# Patient Record
Sex: Male | Born: 1975
Health system: Southern US, Community
[De-identification: ages and names within clinical notes are randomized; demographics above are authoritative.]

## PROBLEM LIST (undated history)

## (undated) DIAGNOSIS — Z87448 Personal history of other diseases of urinary system: Secondary | ICD-10-CM

## (undated) DIAGNOSIS — Z940 Kidney transplant status: Secondary | ICD-10-CM

## (undated) HISTORY — DX: Kidney transplant status: Z94.0

## (undated) HISTORY — PX: NEPHRECTOMY TRANSPLANTED ORGAN: SUR880

## (undated) HISTORY — DX: Personal history of other diseases of urinary system: Z87.448

---

## 2007-03-06 HISTORY — PX: KIDNEY TRANSPLANT: SHX239

## 2011-02-08 DIAGNOSIS — Z940 Kidney transplant status: Secondary | ICD-10-CM | POA: Insufficient documentation

## 2011-02-08 DIAGNOSIS — I129 Hypertensive chronic kidney disease with stage 1 through stage 4 chronic kidney disease, or unspecified chronic kidney disease: Secondary | ICD-10-CM | POA: Insufficient documentation

## 2011-11-13 ENCOUNTER — Encounter: Payer: Self-pay | Admitting: Family Medicine

## 2011-11-13 ENCOUNTER — Ambulatory Visit (INDEPENDENT_AMBULATORY_CARE_PROVIDER_SITE_OTHER): Payer: Self-pay | Admitting: Family Medicine

## 2011-11-13 VITALS — BP 126/77 | HR 64 | Ht 72.0 in | Wt 142.0 lb

## 2011-11-13 DIAGNOSIS — Z87448 Personal history of other diseases of urinary system: Secondary | ICD-10-CM

## 2011-11-13 DIAGNOSIS — Z940 Kidney transplant status: Secondary | ICD-10-CM

## 2011-11-13 MED ORDER — MYCOPHENOLATE MOFETIL 500 MG PO TABS: 500.0000 mg | ORAL_TABLET | Freq: Two times a day (BID) | ORAL | Status: DC

## 2011-11-13 MED ORDER — TACROLIMUS 0.5 MG PO CAPS: 0.5000 mg | ORAL_CAPSULE | Freq: Two times a day (BID) | ORAL | Status: AC

## 2011-11-13 MED ORDER — PREDNISONE 5 MG PO TABS: 5.0000 mg | ORAL_TABLET | Freq: Every day | ORAL | Status: DC

## 2011-11-13 MED ORDER — TACROLIMUS 1 MG PO CAPS: 2.0000 mg | ORAL_CAPSULE | Freq: Two times a day (BID) | ORAL | Status: DC

## 2011-11-13 NOTE — Patient Instructions (Signed)
Health Maintenance, Males A healthy lifestyle and preventative care can promote health and wellness.  Maintain regular health, dental, and eye exams.   Eat a healthy diet. Foods like vegetables, fruits, whole grains, low-fat dairy products, and lean protein foods contain the nutrients you need without too many calories. Decrease your intake of foods high in solid fats, added sugars, and salt. Get information about a proper diet from your caregiver, if necessary.   Regular physical exercise is one of the most important things you can do for your health. Most adults should get at least 150 minutes of moderate-intensity exercise (any activity that increases your heart rate and causes you to sweat) each week. In addition, most adults need muscle-strengthening exercises on 2 or more days a week.    Maintain a healthy weight. The body mass index (BMI) is a screening tool to identify possible weight problems. It provides an estimate of body fat based on height and weight. Your caregiver can help determine your BMI, and can help you achieve or maintain a healthy weight. For adults 20 years and older:   A BMI below 18.5 is considered underweight.   A BMI of 18.5 to 24.9 is normal.   A BMI of 25 to 29.9 is considered overweight.   A BMI of 30 and above is considered obese.   Maintain normal blood lipids and cholesterol by exercising and minimizing your intake of saturated fat. Eat a balanced diet with plenty of fruits and vegetables. Blood tests for lipids and cholesterol should begin at age 20 and be repeated every 5 years. If your lipid or cholesterol levels are high, you are over 50, or you are a high risk for heart disease, you may need your cholesterol levels checked more frequently.Ongoing high lipid and cholesterol levels should be treated with medicines, if diet and exercise are not effective.   If you smoke, find out from your caregiver how to quit. If you do not use tobacco, do not start.    If you choose to drink alcohol, do not exceed 2 drinks per day. One drink is considered to be 12 ounces (355 mL) of beer, 5 ounces (148 mL) of wine, or 1.5 ounces (44 mL) of liquor.   Avoid use of street drugs. Do not share needles with anyone. Ask for help if you need support or instructions about stopping the use of drugs.   High blood pressure causes heart disease and increases the risk of stroke. Blood pressure should be checked at least every 1 to 2 years. Ongoing high blood pressure should be treated with medicines if weight loss and exercise are not effective.   If you are 45 to 34 years old, ask your caregiver if you should take aspirin to prevent heart disease.   Diabetes screening involves taking a blood sample to check your fasting blood sugar level. This should be done once every 3 years, after age 45, if you are within normal weight and without risk factors for diabetes. Testing should be considered at a younger age or be carried out more frequently if you are overweight and have at least 1 risk factor for diabetes.   Colorectal cancer can be detected and often prevented. Most routine colorectal cancer screening begins at the age of 50 and continues through age 75. However, your caregiver may recommend screening at an earlier age if you have risk factors for colon cancer. On a yearly basis, your caregiver may provide home test kits to check for hidden   blood in the stool. Use of a small camera at the end of a tube, to directly examine the colon (sigmoidoscopy or colonoscopy), can detect the earliest forms of colorectal cancer. Talk to your caregiver about this at age 50, when routine screening begins. Direct examination of the colon should be repeated every 5 to 10 years through age 75, unless early forms of pre-cancerous polyps or small growths are found.   Hepatitis C blood testing is recommended for all people born from 1945 through 1965 and any individual with known risks for  hepatitis C.   Healthy men should no longer receive prostate-specific antigen (PSA) blood tests as part of routine cancer screening. Consult with your caregiver about prostate cancer screening.   Testicular cancer screening is not recommended for adolescents or adult males who have no symptoms. Screening includes self-exam, caregiver exam, and other screening tests. Consult with your caregiver about any symptoms you have or any concerns you have about testicular cancer.   Practice safe sex. Use condoms and avoid high-risk sexual practices to reduce the spread of sexually transmitted infections (STIs).   Use sunscreen with a sun protection factor (SPF) of 30 or greater. Apply sunscreen liberally and repeatedly throughout the day. You should seek shade when your shadow is shorter than you. Protect yourself by wearing long sleeves, pants, a wide-brimmed hat, and sunglasses year round, whenever you are outdoors.   Notify your caregiver of new moles or changes in moles, especially if there is a change in shape or color. Also notify your caregiver if a mole is larger than the size of a pencil eraser.   A one-time screening for abdominal aortic aneurysm (AAA) and surgical repair of large AAAs by sound wave imaging (ultrasonography) is recommended for ages 65 to 75 years who are current or former smokers.   Stay current with your immunizations.  Document Released: 08/18/2007 Document Revised: 02/08/2011 Document Reviewed: 07/17/2010 [employer] Patient Information 2012 [employer]. 

## 2011-11-13 NOTE — Progress Notes (Signed)
  Subjective:    Patient ID: Andrew Maxwell, male    DOB: 1975-06-04, 36 y.o.   MRN: [id number]  HPI  The patient is here to get established for nephrology referral through the [facility]. Patient had kidney transplant 4 years ago this December and has done quite well he is currently on 3 different medications the CellCept , Deltasone and Prograf which he takes daily. Patient normally sees his nephrologist in December and we will need to try to maintain that   Review of Systems  All other systems reviewed and are negative.     BP 126/77  Pulse 64  Ht 6' (1.829 m)  Wt 142 lb (64.411 kg)  BMI 19.26 kg/m2  SpO2 100% Objective:   Physical Exam  Constitutional: He is oriented to person, place, and time. He appears well-developed and well-nourished.  HENT:  Head: Normocephalic.  Neck: Neck supple.  Cardiovascular: Normal rate, regular rhythm and normal heart sounds.   No murmur heard. Pulmonary/Chest: Effort normal and breath sounds normal. No respiratory distress.  Musculoskeletal: Normal range of motion.  Neurological: He is alert and oriented to person, place, and time.  Skin: Skin is warm and dry.  Psychiatric: He has a normal mood and affect.      Assessment & Plan:  #1 history renal failure with subsequent kidney transplant. We will make a referral to come system nephrologist. I'll give him one month and one refill of his four medications that he uses to prevent rejection and make a referral to nephrologist. Since he is using the indigent program through [facility] both the nephrologist and blood work needs to be done at a [facility] facility.

## 2012-03-06 DIAGNOSIS — Z7960 Long term (current) use of unspecified immunomodulators and immunosuppressants: Secondary | ICD-10-CM | POA: Insufficient documentation

## 2012-03-06 DIAGNOSIS — Z79899 Other long term (current) drug therapy: Secondary | ICD-10-CM | POA: Insufficient documentation

## 2012-04-08 ENCOUNTER — Ambulatory Visit (INDEPENDENT_AMBULATORY_CARE_PROVIDER_SITE_OTHER): Payer: Self-pay | Admitting: Family Medicine

## 2012-04-08 ENCOUNTER — Encounter: Payer: Self-pay | Admitting: Family Medicine

## 2012-04-08 VITALS — BP 138/71 | HR 81 | Temp 98.3°F | Wt 153.0 lb

## 2012-04-08 DIAGNOSIS — J329 Chronic sinusitis, unspecified: Secondary | ICD-10-CM

## 2012-04-08 DIAGNOSIS — A499 Bacterial infection, unspecified: Secondary | ICD-10-CM

## 2012-04-08 DIAGNOSIS — B9689 Other specified bacterial agents as the cause of diseases classified elsewhere: Secondary | ICD-10-CM

## 2012-04-08 MED ORDER — AMOXICILLIN-POT CLAVULANATE 500-125 MG PO TABS: ORAL_TABLET | ORAL | Status: AC

## 2012-04-08 NOTE — Progress Notes (Signed)
CC: Andrew Maxwell is a 37 y.o. male is here for Sinusitis and Sore Throat   Subjective: HPI:  Patient complains of facial pressure localized between the eyes and just above the left eye. It is worse with lying down. It has been present for a little more than 7 days. It started with fatigue and mild cough however both of these had resolved without intervention.  He describes mild subjective fevers and chills for the past 2 days. He's had a runny nose with increasing volume and thickness of dark discharge. There been no interventions as of yet. Symptoms are described as moderate in severity, worsening on a daily basis. Present all hours a day but not interfering with sleep. Nothing makes symptoms better or worse other than above. Denies motor or sensory disturbances, headaches other than that described above. Denies confusion, sore throat, hearing loss, eye pain, ocular complaints, ear complaints, shortness of breath, cough.   Review Of Systems Outlined In HPI  Past Medical History  Diagnosis Date  . H/O renal failure      History reviewed. No pertinent family history.   History  Substance Use Topics  . Smoking status: Never Smoker   . Smokeless tobacco: Never Used  . Alcohol Use: No     Objective: Filed Vitals:   04/08/12 1438  BP: 138/71  Pulse: 81  Temp: 98.3 F (36.8 C)    General: Alert and Oriented, No Acute Distress HEENT: Pupils equal, round, reactive to light. Conjunctivae clear.  External ears unremarkable, canals clear with intact TMs with appropriate landmarks.  Middle ear appears open without effusion. Erythematous and boggy inferior turbinates with moderate mucoid discharge.  Moist mucous membranes, pharynx without inflammation nor lesions.  Neck supple without palpable lymphadenopathy nor abnormal masses. Left frontal sinus tenderness to percussion Lungs: Clear to auscultation bilaterally, no wheezing/ronchi/rales.  Comfortable work of breathing. Good air  movement. Cardiac: Regular rate and rhythm. Normal S1/S2.  No murmurs, rubs, nor gallops.   Extremities: No peripheral edema.  Strong peripheral pulses.  Mental Status: No depression, anxiety, nor agitation. Skin: Warm and dry.  Assessment & Plan: [name] was seen today for sinusitis and sore throat.  Diagnoses and associated orders for this visit:  Bacterial sinusitis - amoxicillin-clavulanate (AUGMENTIN) 500-125 MG per tablet; Take one by mouth every 8 hours for ten total days.    Bacterial sinusitis: Discussed likelihood that this still could be viral sinusitis given duration greater than 7 days and immunosuppression therapy joint decision to start Augmentin. Symptom control with nasal saline, humidifier, alka seltzer cold and soinus.Signs and symptoms requring emergent/urgent reevaluation were discussed with the patient.  Return if symptoms worsen or fail to improve.

## 2012-10-26 ENCOUNTER — Emergency Department
Admission: EM | Admit: 2012-10-26 | Discharge: 2012-10-26 | Disposition: A | Discharge status: Home / Self Care | Payer: BC Managed Care – PPO | Source: Home / Self Care | Attending: Emergency Medicine | Admitting: Emergency Medicine

## 2012-10-26 DIAGNOSIS — J069 Acute upper respiratory infection, unspecified: Secondary | ICD-10-CM

## 2012-10-26 MED ORDER — AMOXICILLIN-POT CLAVULANATE 875-125 MG PO TABS: 1.0000 | ORAL_TABLET | Freq: Two times a day (BID) | ORAL | Status: DC

## 2012-10-26 NOTE — ED Notes (Signed)
States sinus pressure and congestion started yesterday without relief from Zyrtec

## 2012-10-26 NOTE — ED Provider Notes (Signed)
CSN: [id number]     Arrival date & time 10/26/12  1640 History     First MD Initiated Contact with Patient 10/26/12 1648     Chief Complaint  Patient presents with  . Facial Pain   (Consider location/radiation/quality/duration/timing/severity/associated sxs/prior Treatment) HPI Andrew Maxwell is a 37 y.o. male who complains of onset of cold symptoms for 3 days.  The symptoms are constant and mild-moderate in severity.  He has a history of a kidney transplant.  Flying later this week. No sore throat No cough No pleuritic pain No wheezing + nasal congestion + post-nasal drainage + sinus pain/pressure No chest congestion No itchy/red eyes No earache No hemoptysis No SOB No chills/sweats No fever No nausea No vomiting No abdominal pain No diarrhea No skin rashes No fatigue No myalgias No headache    Past Medical History  Diagnosis Date  . H/O renal failure   . History of renal transplant    Past Surgical History  Procedure Laterality Date  . Kidney transplant  2009   Family History  Problem Relation Age of Onset  . Diabetes Neg Hx   . Hypertension Neg Hx    History  Substance Use Topics  . Smoking status: Never Smoker   . Smokeless tobacco: Never Used  . Alcohol Use: No    Review of Systems  All other systems reviewed and are negative.    Allergies  Review of patient's allergies indicates no known allergies.  Home Medications   Current Outpatient Rx  Name  Route  Sig  Dispense  Refill  . amoxicillin-clavulanate (AUGMENTIN) 875-125 MG per tablet   Oral   Take 1 tablet by mouth 2 (two) times daily.   14 tablet   0   . mycophenolate (CELLCEPT) 500 MG tablet   Oral   Take 1 tablet (500 mg total) by mouth 2 (two) times daily.   60 tablet   1   . predniSONE (DELTASONE) 5 MG tablet   Oral   Take 1 tablet (5 mg total) by mouth daily.   30 tablet   1   . tacrolimus (PROGRAF) 0.5 MG capsule   Oral   Take 1 capsule (0.5 mg total) by mouth 2 (two)  times daily.   60 capsule   1   . tacrolimus (PROGRAF) 1 MG capsule   Oral   Take 2 capsules (2 mg total) by mouth 2 (two) times daily.   120 capsule   1    BP 110/65  Pulse 60  Temp(Src) 97.8 F (36.6 C) (Oral)  Ht 6' (1.829 m)  Wt 141 lb 8 oz (64.184 kg)  BMI 19.19 kg/m2  SpO2 99% Physical Exam  Nursing note and vitals reviewed. Constitutional: He is oriented to person, place, and time. He appears well-developed and well-nourished.  HENT:  Head: Normocephalic and atraumatic.  Right Ear: Tympanic membrane, external ear and ear canal normal.  Left Ear: Tympanic membrane, external ear and ear canal normal.  Nose: Mucosal edema and rhinorrhea present.  Mouth/Throat: Posterior oropharyngeal erythema present. No oropharyngeal exudate or posterior oropharyngeal edema.  Eyes: No scleral icterus.  Neck: Neck supple.  Cardiovascular: Regular rhythm and normal heart sounds.   Pulmonary/Chest: Effort normal and breath sounds normal. No respiratory distress.  Neurological: He is alert and oriented to person, place, and time.  Skin: Skin is warm and dry.  Psychiatric: He has a normal mood and affect. His speech is normal.    ED Course   Procedures (including critical  care time)  Labs Reviewed - No data to display No results found. 1. Acute upper respiratory infections of unspecified site     MDM  1)  Take the prescribed antibiotic as instructed. 2)  Use nasal saline solution (over the counter) at least 3 times a day. 3)  Use over the counter decongestants like Zyrtec-D every 12 hours as needed to help with congestion.  If you have hypertension, do not take medicines with sudafed.  4)  Can take tylenol every 6 hours or motrin every 8 hours for pain or fever. 5)  Follow up with your primary doctor if no improvement in 5-7 days, sooner if increasing pain, fever, or new symptoms.     Marlaine Hind, MD 10/26/12 [phone]

## 2013-03-24 ENCOUNTER — Ambulatory Visit (INDEPENDENT_AMBULATORY_CARE_PROVIDER_SITE_OTHER): Payer: BC Managed Care – PPO | Admitting: Family Medicine

## 2013-03-24 ENCOUNTER — Encounter: Payer: Self-pay | Admitting: Family Medicine

## 2013-03-24 VITALS — BP 122/66 | HR 87 | Temp 100.6°F | Wt 148.0 lb

## 2013-03-24 DIAGNOSIS — R509 Fever, unspecified: Secondary | ICD-10-CM

## 2013-03-24 LAB — POCT INFLUENZA A/B
INFLUENZA B, POC: NEGATIVE
Influenza A, POC: NEGATIVE

## 2013-03-24 MED ORDER — OSELTAMIVIR PHOSPHATE 75 MG PO CAPS: 75.0000 mg | ORAL_CAPSULE | Freq: Two times a day (BID) | ORAL | Status: DC

## 2013-03-24 NOTE — Progress Notes (Signed)
CC: Andrew MiyamotoStephen Maxwell is a 38 y.o. male is here for Fever   Subjective: HPI:  Complains of fever with a maximum 103.1 earlier this morning that's all began on Monday morning. Was accompanied by chills and subjective fevers onset at the same time. Sunday was beginning to have mild nasal congestion and moderate cough that is worse in the evening almost absent during daytime. Nasal congestion is persistent.Symptoms overall are moderate in severity. No interventions as of yet no sick contacts. Denies confusion, motor sensory disturbances, chest pain, shortness of breath, back pain, abdominal pain, GI disturbance, rashes nor dysuria   Review Of Systems Outlined In HPI  Past Medical History  Diagnosis Date  . H/O renal failure   . History of renal transplant      Family History  Problem Relation Age of Onset  . Diabetes Neg Hx   . Hypertension Neg Hx      History  Substance Use Topics  . Smoking status: Never Smoker   . Smokeless tobacco: Never Used  . Alcohol Use: No     Objective: Filed Vitals:   03/24/13 1358  BP: 122/66  Pulse: 87  Temp: 100.6 F (38.1 C)    General: Alert and Oriented, No Acute Distress appears mildly fatigued HEENT: Pupils equal, round, reactive to light. Conjunctivae clear.  External ears unremarkable, canals clear with intact TMs with appropriate landmarks.  Middle ear appears open without effusion. Pink inferior turbinates.  Moist mucous membranes, pharynx without inflammation nor lesions.  Neck supple without palpable lymphadenopathy nor abnormal masses. Lungs: Clear to auscultation bilaterally, no wheezing/ronchi/rales.  Comfortable work of breathing. Good air movement. Cardiac: Regular rate and rhythm. Normal S1/S2.  No murmurs, rubs, nor gallops.   Abdomen: Soft nontender Skin: Warm and dry.  Assessment & Plan: Andrew SeniorStephen was seen today for fever.  Diagnoses and associated orders for this visit:  Fever, unspecified - POCT Influenza A/B  Other  Orders - oseltamivir (TAMIFLU) 75 MG capsule; Take 1 capsule (75 mg total) by mouth 2 (two) times daily.    Rapid flu test is negative however clinical suspicion is high, given his immunosuppression I would like him to start on Tamiflu, there is no current indication for an antibiotic at this time but we discussed signs and symptoms that would warrant an antibiotic I've asked him to call us if any of these evolve.  Over-the-counter flu or cold medications for flu provided they do not have ibuprofen or nonsteroidal anti-inflammatories  Return if symptoms worsen or fail to improve.

## 2013-03-26 ENCOUNTER — Ambulatory Visit (INDEPENDENT_AMBULATORY_CARE_PROVIDER_SITE_OTHER): Payer: BC Managed Care – PPO | Admitting: Family Medicine

## 2013-03-26 ENCOUNTER — Ambulatory Visit (INDEPENDENT_AMBULATORY_CARE_PROVIDER_SITE_OTHER): Payer: BC Managed Care – PPO

## 2013-03-26 ENCOUNTER — Encounter: Payer: Self-pay | Admitting: Family Medicine

## 2013-03-26 VITALS — BP 111/61 | HR 66 | Temp 97.3°F | Wt 144.0 lb

## 2013-03-26 DIAGNOSIS — R509 Fever, unspecified: Secondary | ICD-10-CM

## 2013-03-26 DIAGNOSIS — R05 Cough: Secondary | ICD-10-CM

## 2013-03-26 DIAGNOSIS — R059 Cough, unspecified: Secondary | ICD-10-CM

## 2013-03-26 DIAGNOSIS — J3489 Other specified disorders of nose and nasal sinuses: Secondary | ICD-10-CM

## 2013-03-26 DIAGNOSIS — J189 Pneumonia, unspecified organism: Secondary | ICD-10-CM

## 2013-03-26 MED ORDER — AZITHROMYCIN 250 MG PO TABS: ORAL_TABLET | ORAL | Status: AC

## 2013-03-26 NOTE — Progress Notes (Signed)
CC: Ronaldo MiyamotoStephen Martenson is a 38 y.o. male is here for pain in back   Subjective: HPI:  Patient complains of continued cough of moderate severity nonproductive accompanied now with back pain localized on the right side of the back not radiating present only with coughing or deep breathing. Cough is unchanged since I saw him last however pain is new and started yesterday morning. Pain is moderate in severity absent at rest or when not breathing. Described only as a sharpness. Overall he feels somewhat better since starting Tamiflu no fever as of yesterday chills are now absent. Appetite is picking back up. Denies blood in sputum, shortness of breath, wheezing, orthopnea, myalgias, nor midline back pain   Review Of Systems Outlined In HPI  Past Medical History  Diagnosis Date  . H/O renal failure   . History of renal transplant      Family History  Problem Relation Age of Onset  . Diabetes Neg Hx   . Hypertension Neg Hx      History  Substance Use Topics  . Smoking status: Never Smoker   . Smokeless tobacco: Never Used  . Alcohol Use: No     Objective: Filed Vitals:   03/26/13 0912  BP: 111/61  Pulse: 66  Temp: 97.3 F (36.3 C)    General: Alert and Oriented, No Acute Distress HEENT: Pupils equal, round, reactive to light. Conjunctivae clear.  External ears unremarkable, canals clear with intact TMs with appropriate landmarks.  Middle ear appears open without effusion. Pink inferior turbinates.  Moist mucous membranes, pharynx without inflammation nor lesions.  Neck supple without palpable lymphadenopathy nor abnormal masses. Lungs: Clear to auscultation bilaterally, no wheezing/ronchi/rales.  Comfortable work of breathing. Good air movement. Cardiac: Regular rate and rhythm. Normal S1/S2.  No murmurs, rubs, nor gallops.   Mental Status: No depression, anxiety, nor agitation. Skin: Warm and dry.  Assessment & Plan: Jeannett SeniorStephen was seen today for pain in back.  Diagnoses and  associated orders for this visit:  Cough - DG Chest 2 View; Future  Fever - DG Chest 2 View; Future  CAP (community acquired pneumonia) - azithromycin (ZITHROMAX) 250 MG tablet; Take two tabs at once on day 1, then one tab daily on days 2-5.    Given concern for pneumonia a chest x-ray was obtained: There is no radiographic evidence of pneumonia. Given his immunosuppression I would like to be more aggressive than the average individual by starting azithromycin for any chance that radiographic evidence of pneumonia might not be apparent.  Return if symptoms worsen or fail to improve.

## 2014-02-05 ENCOUNTER — Encounter: Payer: Self-pay | Admitting: Family Medicine

## 2014-02-05 ENCOUNTER — Ambulatory Visit (INDEPENDENT_AMBULATORY_CARE_PROVIDER_SITE_OTHER): Payer: BC Managed Care – PPO | Admitting: Family Medicine

## 2014-02-05 VITALS — BP 119/73 | HR 68 | Ht 73.0 in | Wt 146.0 lb

## 2014-02-05 DIAGNOSIS — J4530 Mild persistent asthma, uncomplicated: Secondary | ICD-10-CM | POA: Diagnosis not present

## 2014-02-05 DIAGNOSIS — Z0289 Encounter for other administrative examinations: Secondary | ICD-10-CM

## 2014-02-05 DIAGNOSIS — J45909 Unspecified asthma, uncomplicated: Secondary | ICD-10-CM | POA: Insufficient documentation

## 2014-02-05 MED ORDER — MONTELUKAST SODIUM 10 MG PO TABS: 10.0000 mg | ORAL_TABLET | Freq: Every day | ORAL | Status: DC

## 2014-02-05 MED ORDER — ALBUTEROL SULFATE HFA 108 (90 BASE) MCG/ACT IN AERS: INHALATION_SPRAY | RESPIRATORY_TRACT | Status: DC

## 2014-02-05 NOTE — Progress Notes (Signed)
CC: Andrew MiyamotoStephen Maxwell is a 38 y.o. male is here for Asthma   Subjective: HPI:  Complains of chest tightness described as inability to take a full deep breath that has been occurring when he is around pet dander and most nights of the week when trying to fall asleep. Wife notes that he is also wheezing in his most nights of the week. Symptoms are improved if he uses albuterol nebulizer that is prescribed to his daughter. Symptoms have been present for about a month now. Nothing else seems to make better or worse. He denies cough, exertional chest pain, confusion, lightheadedness, nasal congestion, sore throat, nor rash.  He had a history of asthma as a child but believes that he outgrew it in his young adulthood.   Review Of Systems Outlined In HPI  Past Medical History  Diagnosis Date  . H/O renal failure   . History of renal transplant     Past Surgical History  Procedure Laterality Date  . Kidney transplant  2009   Family History  Problem Relation Age of Onset  . Diabetes Neg Hx   . Hypertension Neg Hx     History   Social History  . Marital Status: Married    Spouse Name: N/A    Number of Children: N/A  . Years of Education: N/A   Occupational History  . Not on file.   Social History Main Topics  . Smoking status: Never Smoker   . Smokeless tobacco: Never Used  . Alcohol Use: No  . Drug Use: No  . Sexual Activity: Yes    Birth Control/ Protection: Surgical   Other Topics Concern  . Not on file   Social History Narrative     Objective: BP 119/73 mmHg  Pulse 68  Ht 6\' 1"  (1.854 m)  Wt 146 lb (66.225 kg)  BMI 19.27 kg/m2  SpO2 100%  General: Alert and Oriented, No Acute Distress HEENT: Pupils equal, round, reactive to light. Conjunctivae clear.  External ears unremarkable, canals clear with intact TMs with appropriate landmarks.  Middle ear appears open without effusion. Pink inferior turbinates.  Moist mucous membranes, pharynx without inflammation nor  lesions.  Neck supple without palpable lymphadenopathy nor abnormal masses. Lungs: Clear to auscultation bilaterally, no wheezing/ronchi/rales.  Comfortable work of breathing. Good air movement. Cardiac: Regular rate and rhythm. Normal S1/S2.  No murmurs, rubs, nor gallops.   Extremities: No peripheral edema.  Strong peripheral pulses.  Mental Status: No depression, anxiety, nor agitation. Skin: Warm and dry.  Assessment & Plan: Andrew Maxwell was seen today for asthma.  Diagnoses and associated orders for this visit:  Asthma, chronic, mild persistent, uncomplicated  Other Orders - montelukast (SINGULAIR) 10 MG tablet; Take 1 tablet (10 mg total) by mouth at bedtime. - albuterol (PROVENTIL HFA;VENTOLIN HFA) 108 (90 BASE) MCG/ACT inhaler; Inhale two puffs every 4-6 hours only as needed for shortness of breath or wheezing.    Asthma: Uncontrolled chronic condition, begin Singulair on a daily basis to prevent his chief complaint symptoms. For any breakthrough symptoms begin albuterol inhaler. Return sooner than 6 months if he begins to use albuterol more than 2 times a week or continues to have any nocturnal symptoms.  They had many questions regarding whether or not he should go see an allergist. Recommended that if Singulair resolves his symptoms and I see no need for allergy referral at this time however if any symptoms persist referral would be reasonable.  25 minutes spent face-to-face during visit today of  which at least 50% was counseling or coordinating care regarding: 1. Asthma, chronic, mild persistent, uncomplicated      Return in about 6 months (around 08/07/2014).

## 2014-04-20 ENCOUNTER — Telehealth: Payer: Self-pay | Admitting: Family Medicine

## 2014-04-20 MED ORDER — MONTELUKAST SODIUM 10 MG PO TABS: 10.0000 mg | ORAL_TABLET | Freq: Every day | ORAL | Status: DC

## 2014-04-20 NOTE — Telephone Encounter (Signed)
Andrew Maxwell, Rx placed in in-box ready for pickup/faxing. (See attachment, unsure where he wants this sent)

## 2014-04-20 NOTE — Telephone Encounter (Signed)
rx sent to [facility] [place] [facility]

## 2014-05-27 IMAGING — CR DG CHEST 2V
2 series · 2 of 2 positions shown · non-contrast
Comparison: None.

CLINICAL DATA: Cough and chest congestion.

EXAM:
CHEST  2 VIEW

[view not recorded (1 of 2)]
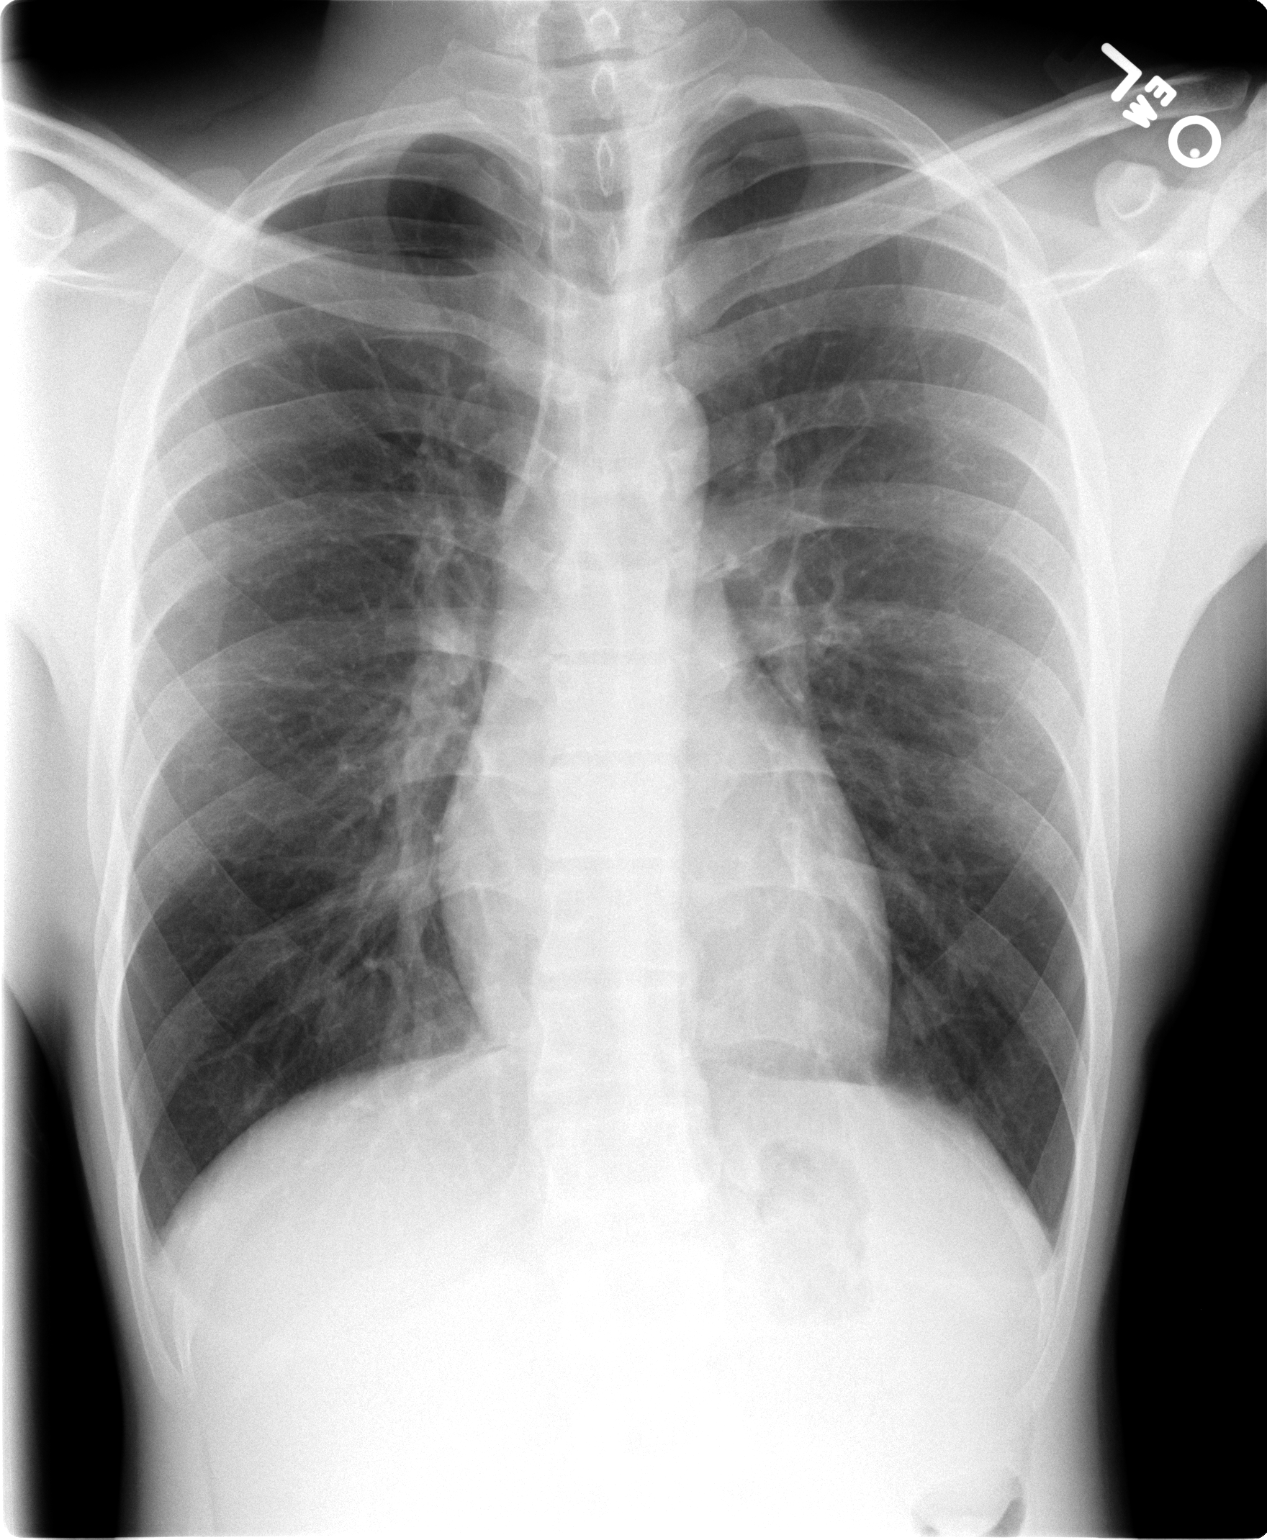

[view not recorded (2 of 2)]
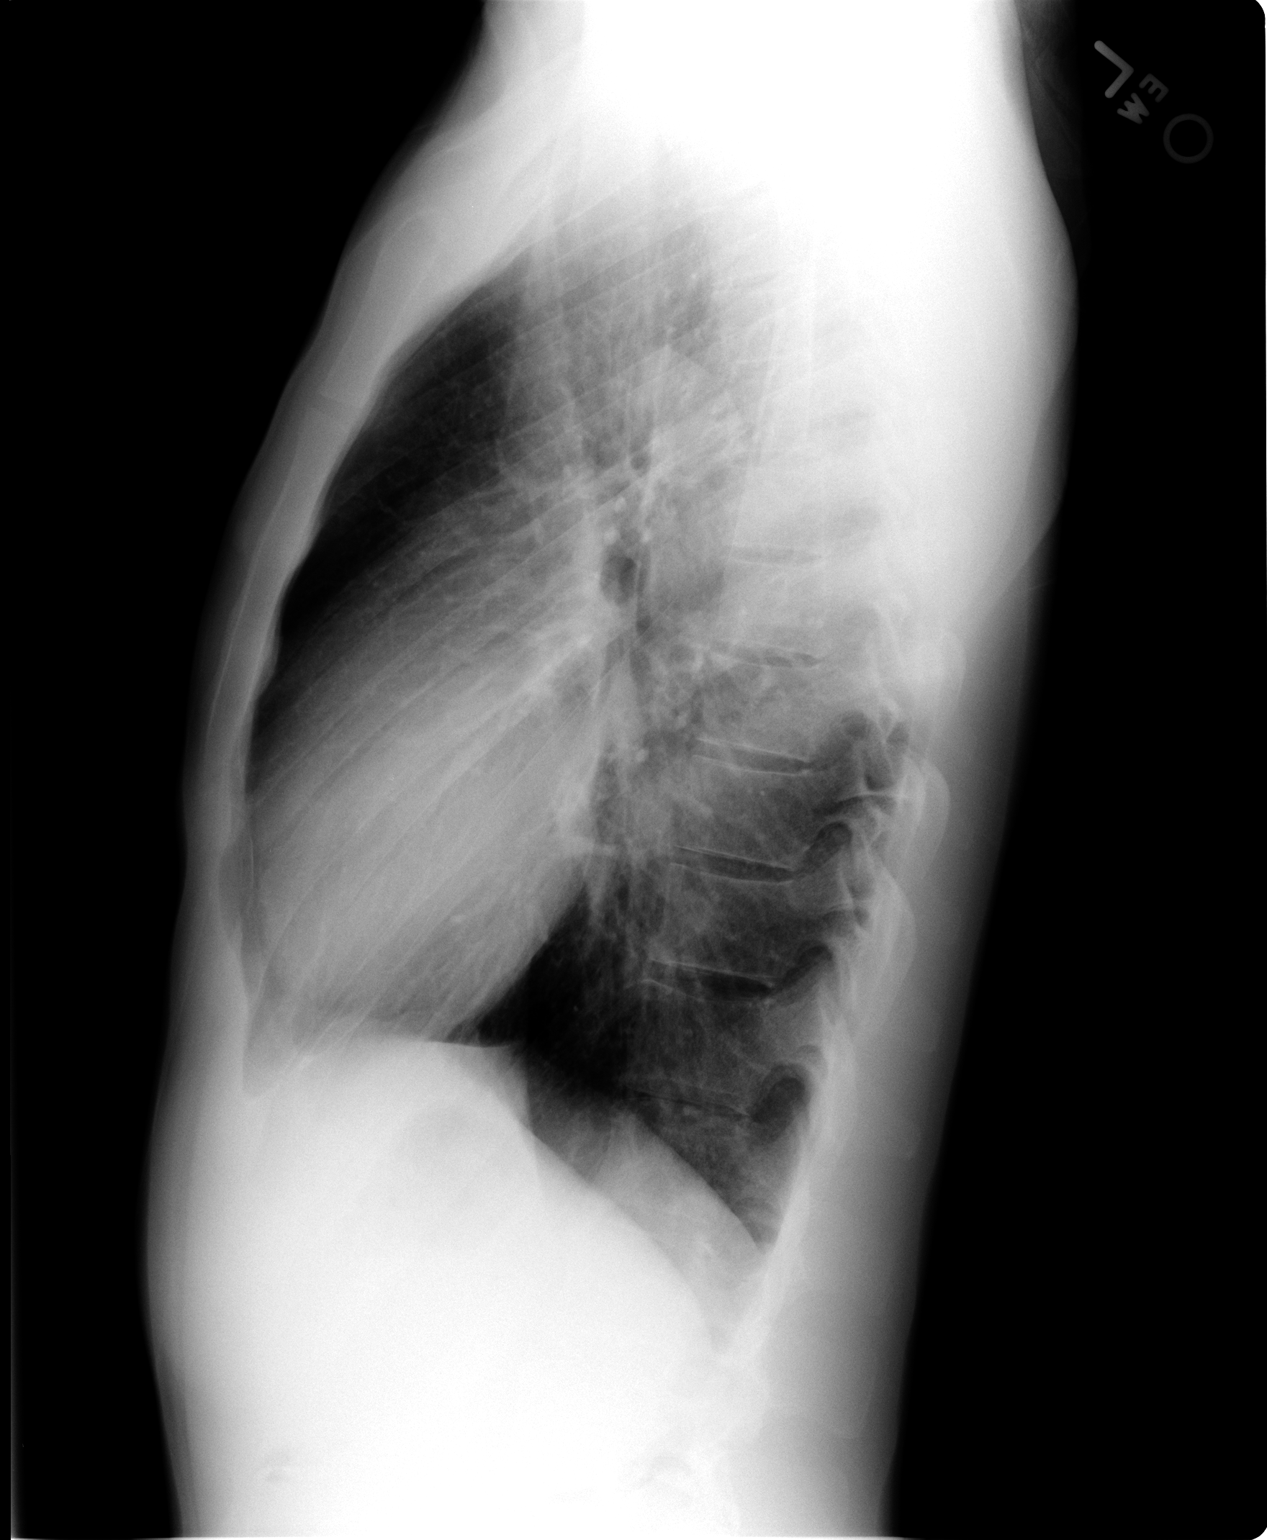

[2 of 2 positions shown; findings below may reference images not displayed]

FINDINGS: The heart size is normal. The lungs are clear. The visualized soft
tissues and bony thorax are unremarkable.
IMPRESSION: Negative two view chest.

## 2015-06-20 DIAGNOSIS — N182 Chronic kidney disease, stage 2 (mild): Secondary | ICD-10-CM

## 2015-06-20 DIAGNOSIS — D631 Anemia in chronic kidney disease: Secondary | ICD-10-CM | POA: Insufficient documentation

## 2015-09-23 ENCOUNTER — Other Ambulatory Visit: Payer: Self-pay | Admitting: Family Medicine

## 2015-12-07 ENCOUNTER — Encounter: Payer: Self-pay | Admitting: Osteopathic Medicine

## 2015-12-07 ENCOUNTER — Ambulatory Visit (INDEPENDENT_AMBULATORY_CARE_PROVIDER_SITE_OTHER): Payer: BLUE CROSS/BLUE SHIELD | Admitting: Osteopathic Medicine

## 2015-12-07 VITALS — BP 131/63 | HR 52 | Temp 97.9°F | Ht 72.0 in | Wt 141.0 lb

## 2015-12-07 DIAGNOSIS — Z23 Encounter for immunization: Secondary | ICD-10-CM

## 2015-12-07 DIAGNOSIS — J3089 Other allergic rhinitis: Secondary | ICD-10-CM | POA: Diagnosis not present

## 2015-12-07 DIAGNOSIS — Z940 Kidney transplant status: Secondary | ICD-10-CM | POA: Insufficient documentation

## 2015-12-07 DIAGNOSIS — N051 Unspecified nephritic syndrome with focal and segmental glomerular lesions: Secondary | ICD-10-CM | POA: Insufficient documentation

## 2015-12-07 MED ORDER — MONTELUKAST SODIUM 10 MG PO TABS: 10.0000 mg | ORAL_TABLET | Freq: Every day | ORAL | 1 refills | Status: DC

## 2015-12-07 MED ORDER — MONTELUKAST SODIUM 10 MG PO TABS: 10.0000 mg | ORAL_TABLET | Freq: Every day | ORAL | 4 refills | Status: AC

## 2015-12-07 NOTE — Progress Notes (Signed)
HPI: Andrew Maxwell is a 40 y.o. male  who presents to [facility] today, 12/07/15,  for chief complaint of:  Chief Complaint  Patient presents with  . Establish Care     SWITCH FROM [facility] SINUS PROBLEMS     . Context/quality: Sinus swelling and congestion,  Reports mostly runny nose. History of mild intermittent asthma, previously on Singulair but has been out of this for some time . Duration: Several months . Timing: Most days . Modifying factors: Sudafed helped a little, Patient also has tried Flonase nasal spray which caused nasal sores so he stopped this. . Assoc signs/symptoms: no significant sinus pain, no dental pain, no headache.   Records reviewed: Last followed up with nephrology 06/20/2015 - status post transplant for focal segmental glomerulosclerosis, currently on immunosuppressive therapy as below. CKD2 with current creatinine stable per note.  Past medical, surgical, social and family history reviewed: Past Medical History:  Diagnosis Date  . H/O renal failure   . History of renal transplant    Past Surgical History:  Procedure Laterality Date  . KIDNEY TRANSPLANT  2009   Social History  Substance Use Topics  . Smoking status: Never Smoker  . Smokeless tobacco: Never Used  . Alcohol use No   Family History  Problem Relation Age of Onset  . Diabetes Neg Hx   . Hypertension Neg Hx      Current medication list and allergy/intolerance information reviewed:   Current Outpatient Prescriptions  Medication Sig Dispense Refill  . montelukast (SINGULAIR) 10 MG tablet Take 1 tablet (10 mg total) by mouth daily. NEED FOLLOW UP APPOINTMENT FOR MORE REFILLS 90 tablet 1  . mycophenolate (CELLCEPT) 500 MG tablet Take 750 mg by mouth 2 (two) times daily. Take two tablets by mouth two times daily     . predniSONE (DELTASONE) 5 MG tablet Take 1 tablet (5 mg total) by mouth daily. 30 tablet 1  . tacrolimus (PROGRAF) 1 MG capsule Take 2  capsules (2 mg total) by mouth 2 (two) times daily. (Patient taking differently: Take 2 mg by mouth 2 (two) times daily. Take 2.5 mg tablets twice daily) 120 capsule 1  . albuterol (PROVENTIL HFA;VENTOLIN HFA) 108 (90 BASE) MCG/ACT inhaler Inhale two puffs every 4-6 hours only as needed for shortness of breath or wheezing. 1 Inhaler 1   No current facility-administered medications for this visit.    No Known Allergies    Review of Systems:  Constitutional:  No  fever, no chills, No recent illness, No unintentional weight changes. No significant fatigue.   HEENT: No  headache, no vision change, no hearing change, No sore throat, +sinus pressure and clear rhinorrhea  Cardiac: No  chest pain, No  pressure, No palpitations  Respiratory:  No  shortness of breath. No  Cough  Neurologic: No  weakness, No  dizziness,   Exam:  BP 131/63   Pulse (!) 52   Temp 97.9 F (36.6 C) (Oral)   Ht 6' (1.829 m)   Wt 141 lb (64 kg)   BMI 19.12 kg/m   Constitutional: VS see above. General Appearance: alert, well-developed, well-nourished, NAD  Eyes: Normal lids and conjunctive, non-icteric sclera  Ears, Nose, Mouth, Throat: MMM, Normal external inspection ears/nares/mouth/lips/gums. TM normal bilaterally. Pharynx/tonsils no erythema, no exudate. Nasal mucosa pale.   Neck: No masses, trachea midline. No thyroid enlargement. No tenderness/mass appreciated. No lymphadenopathy  Respiratory: Normal respiratory effort. no wheeze, no rhonchi, no rales  Cardiovascular: S1/S2 normal,  no murmur, no rub/gallop auscultated. RRR. No lower extremity edema.   Skin: warm, dry, intact. No rash/ulcer.  Psychiatric: Normal judgment/insight. Normal mood and affect. Oriented x3.      ASSESSMENT/PLAN:   Chronic allergic rhinitis due to other allergic trigger, unspecified seasonality - Plan: montelukast (SINGULAIR) 10 MG tablet  Need for diphtheria-tetanus-pertussis (Tdap) vaccine, adult/adolescent - Plan:  Tdap vaccine greater than or equal to 7yo IM  Need for prophylactic vaccination and inoculation against influenza - Plan: Flu Vaccine QUAD 36+ mos IM   Patient Instructions  Will get back on the Singulair. Can add Allegra/Zyrtec/Claritin +/- decongestant ("D" formulations behind the counter) to that, and can add Flonase/Nasonex. Can use saline nasal spray. If no better with these things, call me and I will place referral to allergist.   Consider allergist referral/CT sinus if no improvement, I have low suspicion for persistent sinus infection given more rhinorrhea symptoms and less sinus pain.  Visit summary with medication list and pertinent instructions was printed for patient to review. All questions at time of visit were answered - patient instructed to contact office with any additional concerns. ER/RTC precautions were reviewed with the patient. Follow-up plan: Return for annual physical at your convenience when due for this.

## 2015-12-07 NOTE — Patient Instructions (Addendum)
Will get back on the Singulair. Can add Allegra/Zyrtec/Claritin +/- decongestant ("D" formulations behind the counter) to that, and can add Flonase/Nasonex. Can use saline nasal spray. If no better with these things, call me and I will place referral to allergist.

## 2016-03-12 ENCOUNTER — Other Ambulatory Visit: Payer: Self-pay | Admitting: Orthopedic Surgery

## 2016-10-25 ENCOUNTER — Ambulatory Visit (INDEPENDENT_AMBULATORY_CARE_PROVIDER_SITE_OTHER): Payer: BLUE CROSS/BLUE SHIELD | Admitting: Physician Assistant

## 2016-10-25 ENCOUNTER — Encounter: Payer: Self-pay | Admitting: Physician Assistant

## 2016-10-25 VITALS — BP 148/83 | HR 67 | Temp 98.3°F | Wt 137.0 lb

## 2016-10-25 DIAGNOSIS — S91331A Puncture wound without foreign body, right foot, initial encounter: Secondary | ICD-10-CM

## 2016-10-25 NOTE — Progress Notes (Signed)
HPI:                                                                Andrew Maxwell is a 41 y.o. male who presents to [facility]: Primary Care Sports Medicine today for right foot injury  Patient reports he stepped on a rusty nail with his right foot this morning. Patient was wearing shoes. Injury occurred at a job site. Endorses mild discomfort with weight bearing.Tetanus update 12/07/2015  Past Medical History:  Diagnosis Date  . H/O renal failure   . History of renal transplant    Past Surgical History:  Procedure Laterality Date  . KIDNEY TRANSPLANT  2009   Social History  Substance Use Topics  . Smoking status: Never Smoker  . Smokeless tobacco: Never Used  . Alcohol use No   family history is not on file.  ROS: negative except as noted in the HPI  Medications: Current Outpatient Prescriptions  Medication Sig Dispense Refill  . tacrolimus (PROGRAF) 1 MG capsule Take 3 mg by mouth 2 (two) times daily.    Marland Kitchen albuterol (PROVENTIL HFA;VENTOLIN HFA) 108 (90 BASE) MCG/ACT inhaler Inhale two puffs every 4-6 hours only as needed for shortness of breath or wheezing. 1 Inhaler 1  . montelukast (SINGULAIR) 10 MG tablet Take 1 tablet (10 mg total) by mouth at bedtime. NEED FOLLOW UP APPOINTMENT FOR MORE REFILLS 90 tablet 4  . mycophenolate (CELLCEPT) 500 MG tablet Take 750 mg by mouth 2 (two) times daily. Take two tablets by mouth two times daily      No current facility-administered medications for this visit.    No Known Allergies     Objective:  BP (!) 148/83   Pulse 67   Temp 98.3 F (36.8 C) (Oral)   Wt 137 lb (62.1 kg)   BMI 18.58 kg/m  Gen:  alert, not ill-appearing, no distress, appropriate for age HEENT: head normocephalic without obvious abnormality, conjunctiva and cornea clear, trachea midline Pulm: Normal work of breathing, normal phonation Neuro: alert and oriented x 3, no tremor MSK: extremities atraumatic, normal gait and station Skin:  plantar aspect of right forefoot with superficial puncture wound measuring less than 0.5 cm in depth and approximately 3 mm wide, no foreign body present Psych: good eye contact, euthymic mood, affect mood-congruent, normal speech and thought content   No results found for this or any previous visit (from the past 72 hour(s)). No results found.    Assessment and Plan: 41 y.o. male with   1. Puncture wound of plantar aspect of right foot, initial encounter - wound inspected for foreign body and cleansed with chlorhexidine solution - Tetanus UTD - instructed to monitor for signs of infection  Patient education and anticipatory guidance given Patient agrees with treatment plan Follow-up as needed if symptoms worsen or fail to improve  Levonne Hubert PA-C

## 2017-08-29 ENCOUNTER — Ambulatory Visit (INDEPENDENT_AMBULATORY_CARE_PROVIDER_SITE_OTHER): Payer: Self-pay | Admitting: Physician Assistant

## 2017-08-29 ENCOUNTER — Encounter: Payer: Self-pay | Admitting: Physician Assistant

## 2017-08-29 VITALS — BP 137/79 | HR 51 | Temp 98.2°F | Resp 14 | Wt 145.5 lb

## 2017-08-29 DIAGNOSIS — Z940 Kidney transplant status: Secondary | ICD-10-CM

## 2017-08-29 DIAGNOSIS — Z20818 Contact with and (suspected) exposure to other bacterial communicable diseases: Secondary | ICD-10-CM

## 2017-08-29 MED ORDER — AMOXICILLIN 250 MG PO CAPS: 250.0000 mg | ORAL_CAPSULE | Freq: Two times a day (BID) | ORAL | 0 refills | Status: AC

## 2017-08-29 NOTE — Progress Notes (Signed)
HPI:                                                                Andrew MiyamotoStephen Maxwell is a 42 y.o. male who presents to [facility]: Primary Care Sports Medicine today for exposure to Strep   Patient's wife was diagnosed with GAS pharyngitis in our office today. She has been sick for 1 week with symptoms and has had close contact with patient. He denies fever, chills, malaise, headache, sore throat.  Patient has a history of FSGS leading to ESRD and renal transplant in 2009. 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 He is currently CKD stage IV 2/2 to failing allograft.  Scr 6.17, GFR 11 (08/01/17) Nephrologist is Dr. Patton Sallesocco  [facility] 10/25/2016 12/07/2015  Decreased Interest 0 0  Down, Depressed, Hopeless 0 0  PHQ - 2 Score 0 0    No flowsheet data found.    Past Medical History:  Diagnosis Date  . H/O renal failure   . History of renal transplant    Past Surgical History:  Procedure Laterality Date  . KIDNEY TRANSPLANT  2009   Social History   Tobacco Use  . Smoking status: Never Smoker  . Smokeless tobacco: Never Used  Substance Use Topics  . Alcohol use: No   family history is not on file.    ROS: negative except as noted in the HPI  Medications: Current Outpatient Medications  Medication Sig Dispense Refill  . albuterol (PROVENTIL HFA;VENTOLIN HFA) 108 (90 BASE) MCG/ACT inhaler Inhale two puffs every 4-6 hours only as needed for shortness of breath or wheezing. 1 Inhaler 1  . amoxicillin (AMOXIL) 250 MG capsule Take 1 capsule (250 mg total) by mouth 2 (two) times daily for 10 days. 20 capsule 0  . montelukast (SINGULAIR) 10 MG tablet Take 1 tablet (10 mg total) by mouth at bedtime. NEED FOLLOW UP APPOINTMENT FOR MORE REFILLS 90 tablet 4  . mycophenolate (CELLCEPT) 500 MG tablet Take 750 mg by mouth 2 (two) times daily. Take two tablets by mouth two times daily     .  tacrolimus (PROGRAF) 1 MG capsule Take 3 mg by mouth 2 (two) times daily.     No current facility-administered medications for this visit.    No Known Allergies     Objective:  BP 137/79   Pulse (!) 51   Temp 98.2 F (36.8 C)   Resp 14  Gen:  alert, not ill-appearing, no distress, appropriate for age HEENT: head normocephalic without obvious abnormality, conjunctiva and cornea clear, trachea midline Pulm: Normal work of breathing, normal phonation Neuro: alert and oriented x 3, no tremor MSK: extremities atraumatic, normal gait and station Skin: intact, no rashes on exposed skin, no jaundice, no cyanosis    No results found for this or any previous visit (from the past 72 hour(s)). No results found.    Assessment and Plan: 42 y.o. male with   Exposure to Streptococcal pharyngitis - Plan: amoxicillin (AMOXIL) 250 MG capsule  S/P kidney transplant  Given patient's history of FSGS s/p renal transplant I am going to treat him for post-exposure prophylaxis. I have renally dosed Amoxicillin 250 mg bid x 10 days  based off of most recent renal function Scr 6.17, GFR 11 (08/01/17). Instructed patient to contact his Nephrologist directly. I will also route this office visit note.   Patient education and anticipatory guidance given Patient agrees with treatment plan Follow-up as needed if symptoms worsen or fail to improve  Levonne Hubert PA-C

## 2017-09-25 ENCOUNTER — Emergency Department (INDEPENDENT_AMBULATORY_CARE_PROVIDER_SITE_OTHER): Payer: BLUE CROSS/BLUE SHIELD

## 2017-09-25 ENCOUNTER — Encounter: Payer: Self-pay | Admitting: *Deleted

## 2017-09-25 ENCOUNTER — Emergency Department (INDEPENDENT_AMBULATORY_CARE_PROVIDER_SITE_OTHER)
Admission: EM | Admit: 2017-09-25 | Discharge: 2017-09-25 | Disposition: A | Discharge status: Home / Self Care | Payer: BLUE CROSS/BLUE SHIELD | Source: Home / Self Care | Attending: Family Medicine | Admitting: Family Medicine

## 2017-09-25 ENCOUNTER — Other Ambulatory Visit: Payer: Self-pay

## 2017-09-25 DIAGNOSIS — R509 Fever, unspecified: Secondary | ICD-10-CM | POA: Diagnosis not present

## 2017-09-25 DIAGNOSIS — R0682 Tachypnea, not elsewhere classified: Secondary | ICD-10-CM | POA: Diagnosis not present

## 2017-09-25 DIAGNOSIS — N184 Chronic kidney disease, stage 4 (severe): Secondary | ICD-10-CM

## 2017-09-25 DIAGNOSIS — R110 Nausea: Secondary | ICD-10-CM

## 2017-09-25 DIAGNOSIS — R197 Diarrhea, unspecified: Secondary | ICD-10-CM

## 2017-09-25 LAB — POCT CBC W AUTO DIFF (K'VILLE URGENT CARE)

## 2017-09-25 NOTE — ED Provider Notes (Signed)
[facility]    CSN: [id number] Arrival date & time: 09/25/17  1958     History   Chief Complaint Chief Complaint  Patient presents with  . Abdominal Pain  . Diarrhea    HPI Andrew Maxwell is a 42 y.o. male.   Patient has history of end stage renal disease s/p kidney transplant, followed by Dr. Lester Maxwell at the [facility]. He was started on calcitriol and vitamin D3 six days ago.  Since then he has had persistent nausea, intermittent chills, abdominal cramps and loose stools.  This morning he had shortness of breath briefly.  His wife contacted the kidney service and she was advised to take patient to emergency room or urgent care for evaluation and possible chest X-ray. Patient reports that he actually stopped taking calcitriol and vitamin d3 yesterday.  After taking an electrolyte drink today he states that he feels much better without shortness of breath or cough, and abdominal pain and nausea resolved.  His chills have resolved.  The history is provided by the patient and the spouse.    Past Medical History:  Diagnosis Date  . H/O renal failure   . History of renal transplant     Patient Active Problem List   Diagnosis Date Noted  . FSGS (focal segmental glomerulosclerosis) 12/07/2015  . S/P kidney transplant 12/07/2015  . Anemia of chronic renal failure, stage 2 (mild) 06/20/2015  . Asthma, chronic 02/05/2014  . Long-term use of immunosuppressant medication 03/06/2012  . Benign hypertension with chronic kidney disease, stage II 03-09-2011  . Deceased-donor kidney transplant recipient 09-Mar-2011    Past Surgical History:  Procedure Laterality Date  . KIDNEY TRANSPLANT  2009  . NEPHRECTOMY TRANSPLANTED ORGAN         Home Medications    Prior to Admission medications   Medication Sig Start Date End Date Taking? Authorizing Provider  calcitRIOL (ROCALTROL) 0.5 MCG capsule Take 0.5 mcg by mouth daily.   Yes  [provider]  predniSONE (DELTASONE) 5 MG tablet Take 5 mg by mouth daily with breakfast.   Yes [provider]  tacrolimus (PROGRAF) 1 MG capsule Take 3 mg by mouth 2 (two) times daily.   Yes [provider]  Vitamin D, Cholecalciferol, 1000 units CAPS Take by mouth.   Yes [provider]  montelukast (SINGULAIR) 10 MG tablet Take 1 tablet (10 mg total) by mouth at bedtime. NEED FOLLOW UP APPOINTMENT FOR MORE REFILLS 12/07/15   Sunnie Nielsen, DO  mycophenolate (CELLCEPT) 500 MG tablet Take 750 mg by mouth 2 (two) times daily. Take two tablets by mouth two times daily  11/13/11   Hassan Rowan, MD    Family History Family History  Problem Relation Age of Onset  . Diabetes Neg Hx   . Hypertension Neg Hx     Social History Social History   Tobacco Use  . Smoking status: Never Smoker  . Smokeless tobacco: Never Used  Substance Use Topics  . Alcohol use: No  . Drug use: No     Allergies   Patient has no known allergies.   Review of Systems Review of Systems  Constitutional: Positive for activity change, appetite change, chills and fatigue. Negative for diaphoresis and fever.  HENT: Negative.   Eyes: Negative.   Respiratory: Positive for shortness of breath. Negative for cough, chest tightness and wheezing.   Cardiovascular: Negative for chest pain, palpitations and leg swelling.  Gastrointestinal: Positive for diarrhea and  nausea. Negative for abdominal pain and vomiting.  Genitourinary: Negative.   Musculoskeletal: Negative.   Skin: Negative.   Neurological: Negative for light-headedness and headaches.     Physical Exam Triage Vital Signs ED Triage Vitals [09/25/17 2015]  Enc Vitals Group     BP (!) 151/86     Pulse Rate 61     Resp      Temp 98.1 F (36.7 C)     Temp Source Oral     SpO2 100 %     Weight 141 lb (64 kg)     Height 6' (1.829 m)     Head Circumference      Peak Flow      Pain Score 0     Pain Loc       Pain Edu?      Excl. in GC?    No data found.  Updated Vital Signs BP (!) 151/86 (BP Location: Right Arm)   Pulse 61   Temp 98.1 F (36.7 C) (Oral)   Ht 6' (1.829 m)   Wt 141 lb (64 kg)   SpO2 100%   BMI 19.12 kg/m   Visual Acuity Right Eye Distance:   Left Eye Distance:   Bilateral Distance:    Right Eye Near:   Left Eye Near:    Bilateral Near:     Physical Exam Nursing notes and Vital Signs reviewed. Appearance:  Patient appears stated age, and in no acute distress.    Eyes:  Pupils are equal, round, and reactive to light and accomodation.  Extraocular movement is intact.  Conjunctivae are not inflamed   Pharynx:  Normal; moist mucous membranes  Neck:  Supple.  No adenopathy Lungs:  Clear to auscultation.  Breath sounds are equal.  Moving air well. Heart:  Regular rate and rhythm without murmurs, rubs, or gallops.  Abdomen:  Nontender without masses or hepatosplenomegaly.  Well-healed surgical scar right lower quadrant. Bowel sounds are present and increased.  No CVA or flank tenderness.  Extremities:  No edema.  Skin:  No rash present.     UC Treatments / Results  Labs (all labs ordered are listed, but only abnormal results are displayed) Labs Reviewed  COMPLETE METABOLIC PANEL WITH GFR  POCT CBC W AUTO DIFF ([facility]):  WBC 4.8; LY 20.7; MO 5.5; GR 73.8; Hgb 8.4; Platelets 133     EKG None  Radiology Dg Chest 2 View  Result Date: 09/25/2017 CLINICAL DATA:  Dyspnea and chills, diarrhea EXAM: CHEST - 2 VIEW COMPARISON:  03/26/2013 FINDINGS: The heart size and mediastinal contours are within normal limits. Both lungs are clear. The visualized skeletal structures are unremarkable. Minor scoliosis noted. Trachea is midline. IMPRESSION: No active cardiopulmonary disease. Electronically Signed   By: Andrew Petit.  Maxwell M.D.   On: 09/25/2017 20:36    Procedures Procedures (including critical care time)  Medications Ordered in UC Medications - No data to  display  Initial Impression / Assessment and Plan / UC Course  I have reviewed the triage vital signs and the nursing notes.  Pertinent labs & imaging results that were available during my care of the patient were reviewed by me and considered in my medical decision making (see chart for details).    Negative chest X-ray and stable WBC reassuring. Patient's GI symptoms apparently an adverse effect of calcitriol; he has improved after not taking for 24 hours. Note decreased Hgb 8.4 today (previously 9.6 on 09/09/17).  CMP pending. Recommend follow-up with  Dr. Patton Sallesocco as soon as possible.   Final Clinical Impressions(s) / UC Diagnoses   Final diagnoses:  Chronic kidney disease (CKD), stage IV (severe) (HCC)  Nausea  Diarrhea, unspecified type     Discharge Instructions     Contact Dr. Patton Sallesocco for further instructions    ED Prescriptions    None        Lattie HawBeese, Soham A, MD 09/26/17 1104

## 2017-09-25 NOTE — ED Notes (Signed)
Successful blood draw to right AC x1. Hgb 8.4. Repeated and given to Dr. Cathren HarshBeese

## 2017-09-25 NOTE — ED Triage Notes (Signed)
Patient c/o abdominal pain and diarrhea x 1 week. Chills this AM. Denies vomiting.

## 2017-09-25 NOTE — Discharge Instructions (Signed)
Contact Dr. Patton Sallesocco for further instructions

## 2017-09-26 ENCOUNTER — Telehealth: Payer: Self-pay

## 2017-09-26 LAB — COMPLETE METABOLIC PANEL WITH GFR
AG Ratio: 1.8 (calc) (ref 1.0–2.5)
ALT: 9 U/L (ref 9–46)
AST: 8 U/L — ABNORMAL LOW (ref 10–40)
Albumin: 4.8 g/dL (ref 3.6–5.1)
Alkaline phosphatase (APISO): 87 U/L (ref 40–115)
BILIRUBIN TOTAL: 0.5 mg/dL (ref 0.2–1.2)
BUN/Creatinine Ratio: 9 (calc) (ref 6–22)
BUN: 69 mg/dL — ABNORMAL HIGH (ref 7–25)
CALCIUM: 8.5 mg/dL — AB (ref 8.6–10.3)
CHLORIDE: 107 mmol/L (ref 98–110)
CO2: 17 mmol/L — AB (ref 20–32)
CREATININE: 7.5 mg/dL — AB (ref 0.60–1.35)
GFR, EST AFRICAN AMERICAN: 10 mL/min/{1.73_m2} — AB (ref >=60)
GFR, Est Non African American: 8 mL/min/{1.73_m2} — ABNORMAL LOW (ref >=60)
GLUCOSE: 60 mg/dL — AB (ref 65–99)
Globulin: 2.6 g/dL (calc) (ref 1.9–3.7)
Potassium: 4.2 mmol/L (ref 3.5–5.3)
Sodium: 137 mmol/L (ref 135–146)
TOTAL PROTEIN: 7.4 g/dL (ref 6.1–8.1)

## 2017-09-26 NOTE — Telephone Encounter (Signed)
Notified patient of lab work results, is to follow up with nephrologist.

## 2018-11-26 IMAGING — DX DG CHEST 2V
2 series · 2 of 2 positions shown · non-contrast
Comparison: 03/26/2013

CLINICAL DATA: Dyspnea and chills, diarrhea

EXAM:
CHEST - 2 VIEW

[chest pa]
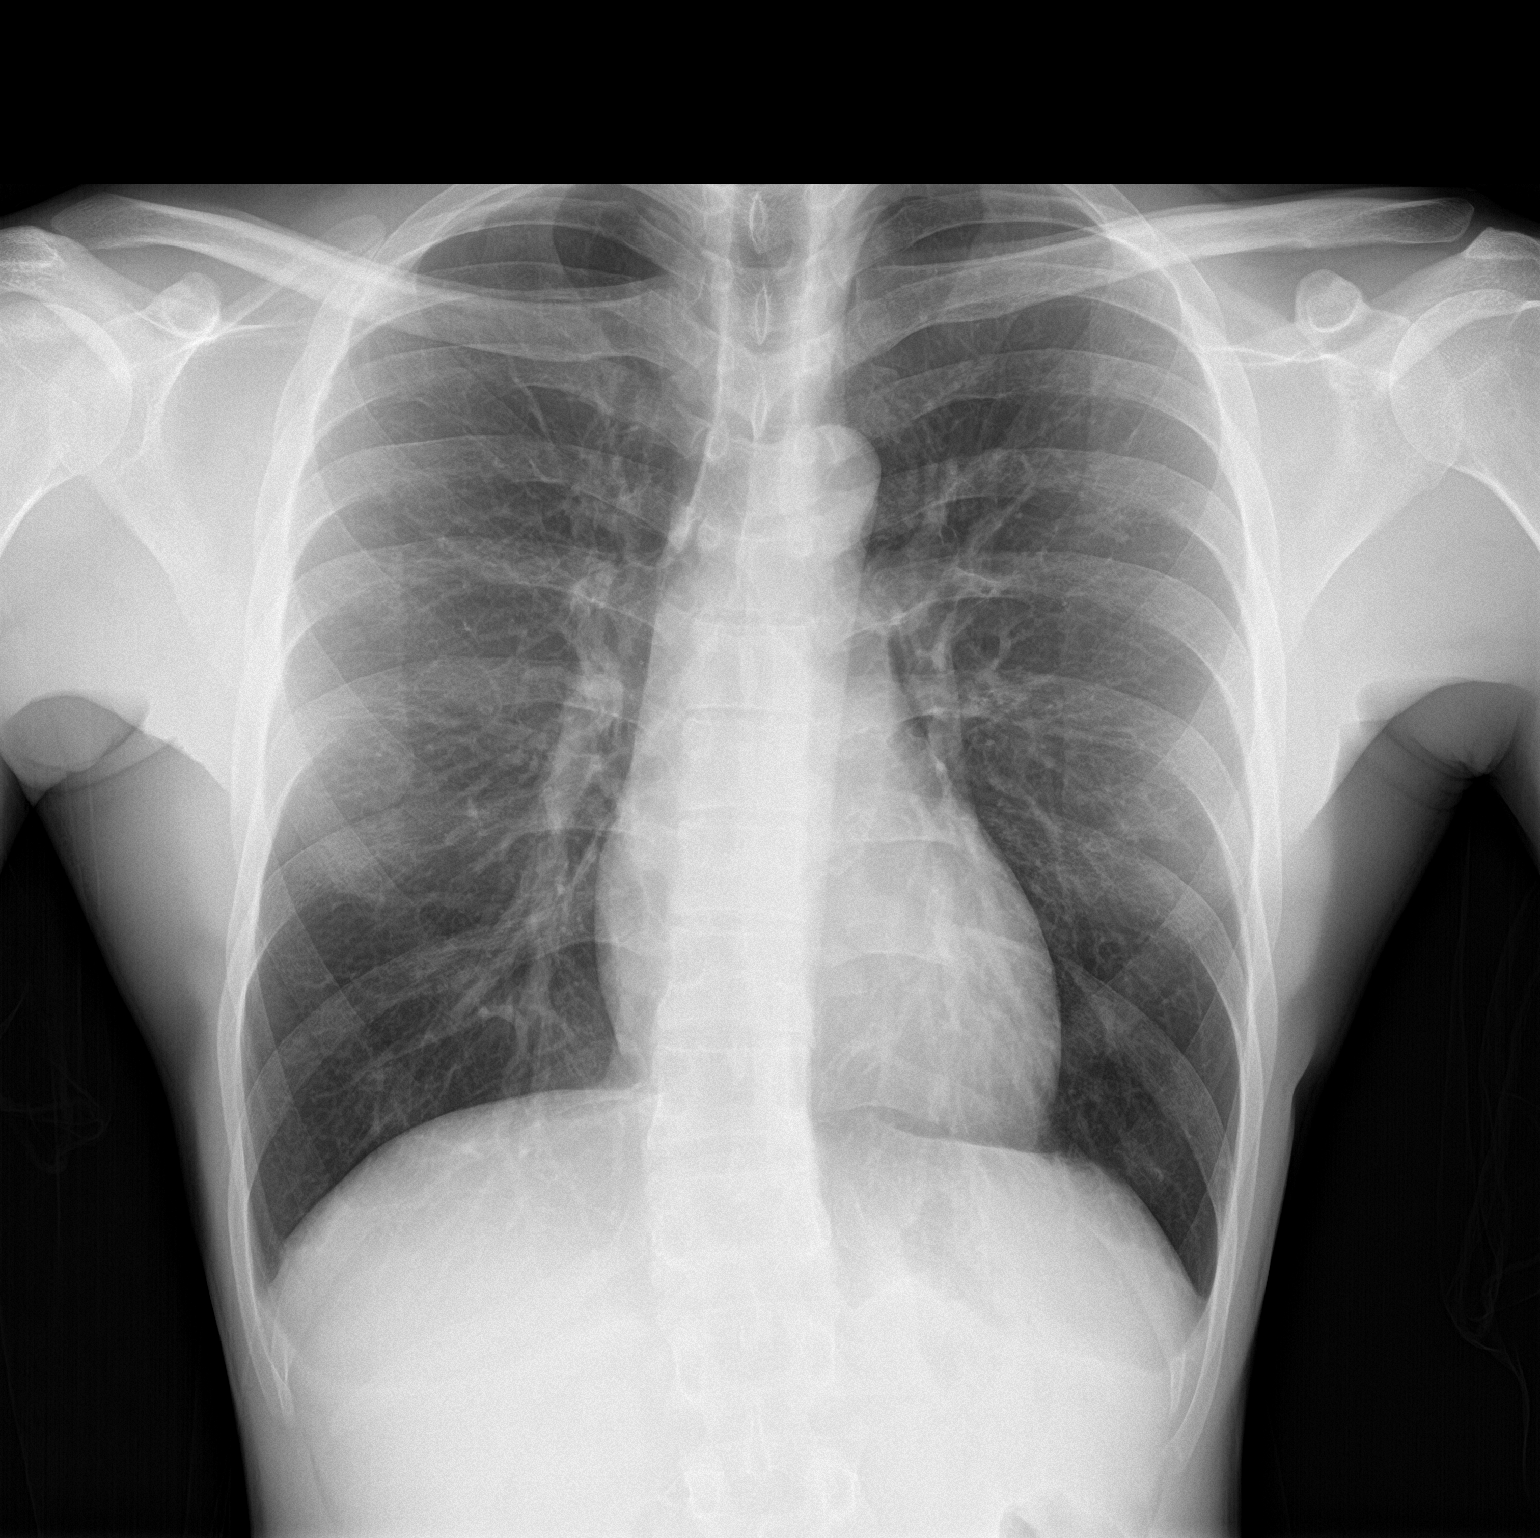

[chest lat]
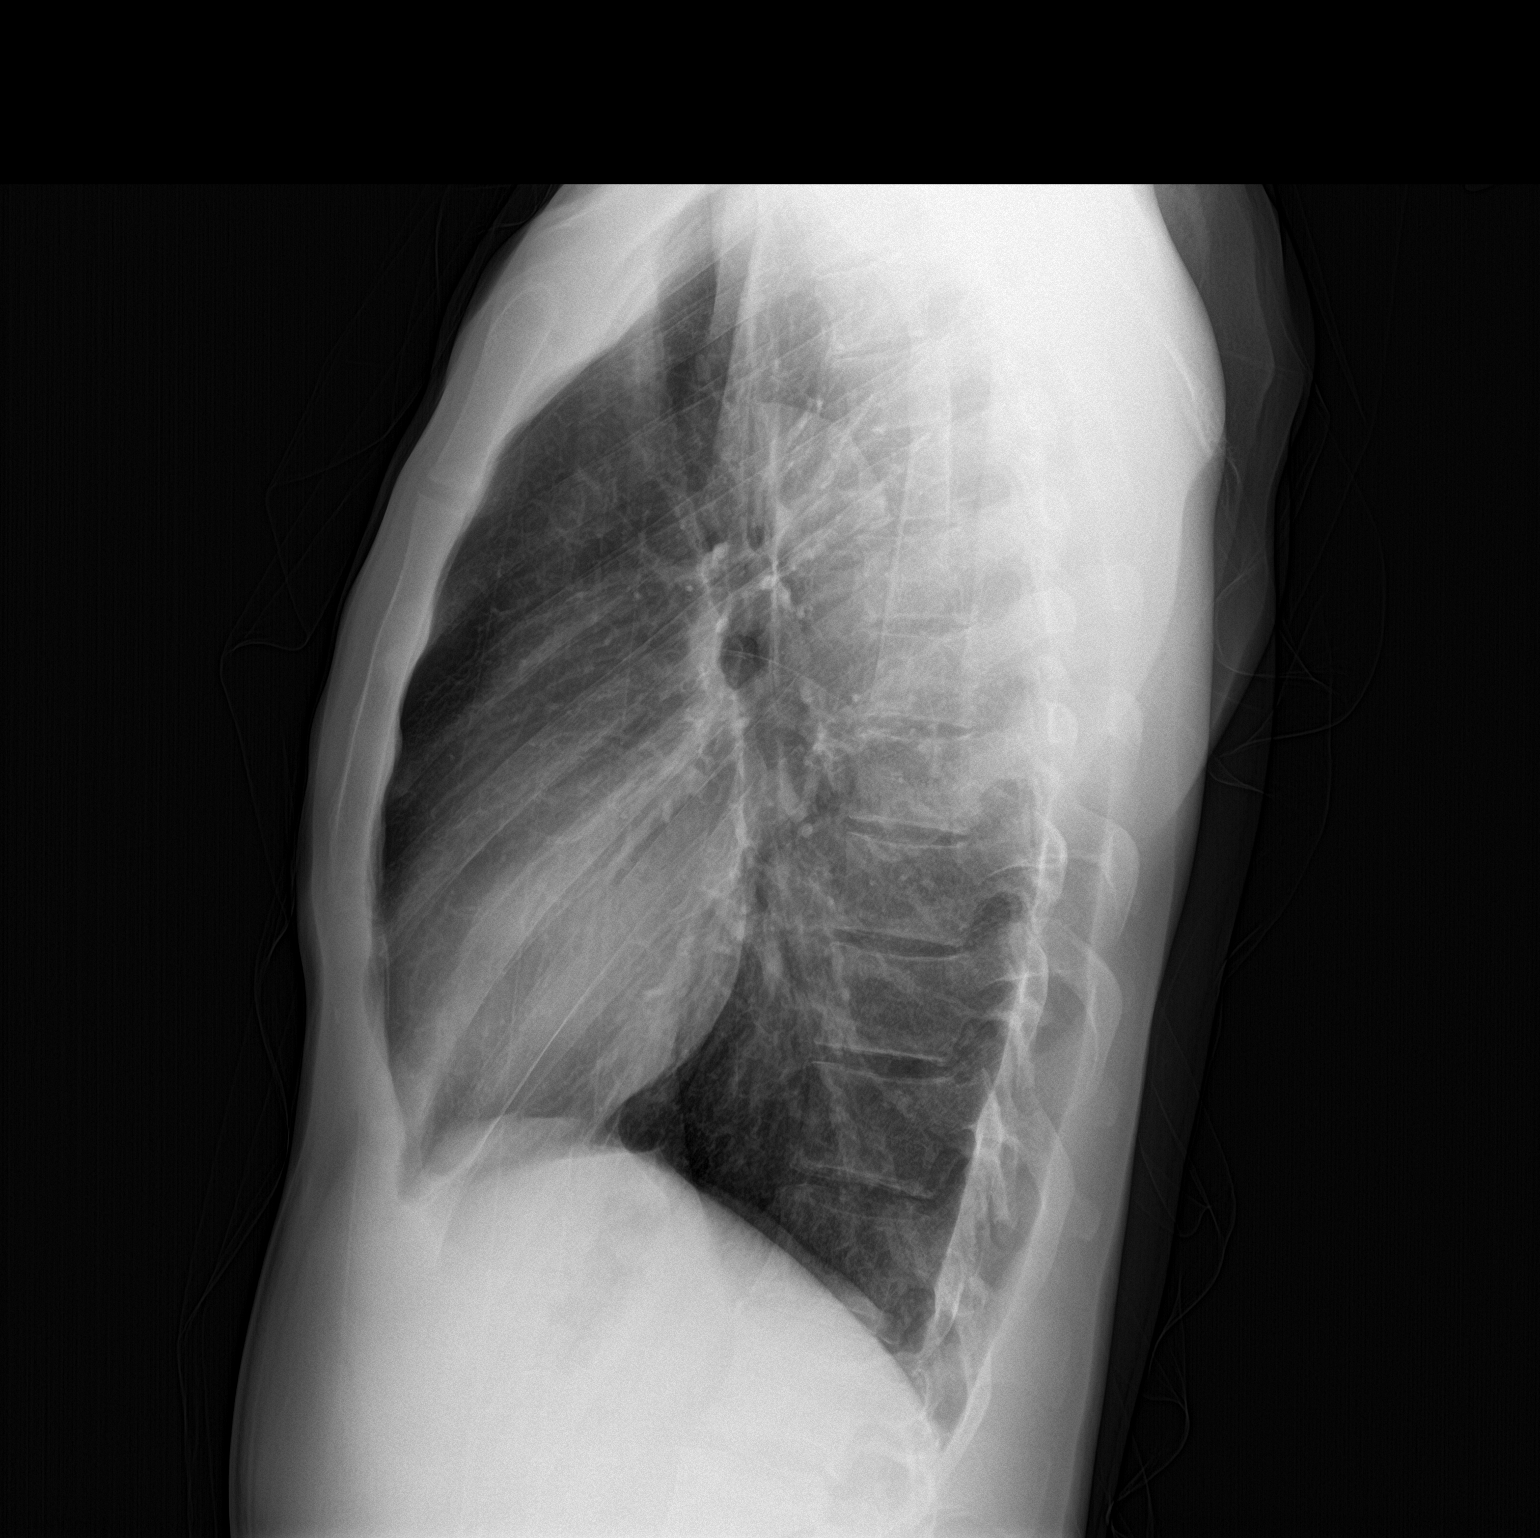

[2 of 2 positions shown; findings below may reference images not displayed]

FINDINGS: The heart size and mediastinal contours are within normal limits.
Both lungs are clear. The visualized skeletal structures are
unremarkable. Minor scoliosis noted. Trachea is midline.
IMPRESSION: No active cardiopulmonary disease.
